# Patient Record
Sex: Female | Born: 1975 | Hispanic: No | Marital: Single | Smoking: Never smoker
Health system: Southern US, Community
[De-identification: ages and names within clinical notes are randomized; demographics above are authoritative.]

## PROBLEM LIST (undated history)

## (undated) DIAGNOSIS — R87629 Unspecified abnormal cytological findings in specimens from vagina: Secondary | ICD-10-CM

## (undated) DIAGNOSIS — Z789 Other specified health status: Secondary | ICD-10-CM

## (undated) HISTORY — DX: Unspecified abnormal cytological findings in specimens from vagina: R87.629

---

## 2002-11-03 ENCOUNTER — Ambulatory Visit (HOSPITAL_COMMUNITY): Admission: RE | Admit: 2002-11-03 | Discharge: 2002-11-03 | Payer: Self-pay | Admitting: *Deleted

## 2003-01-25 ENCOUNTER — Inpatient Hospital Stay (HOSPITAL_COMMUNITY): Admission: AD | Admit: 2003-01-25 | Discharge: 2003-01-26 | Payer: Self-pay | Admitting: *Deleted

## 2005-11-21 ENCOUNTER — Encounter: Admission: RE | Admit: 2005-11-21 | Discharge: 2005-11-21 | Payer: Self-pay | Admitting: Family Medicine

## 2011-07-17 ENCOUNTER — Encounter (HOSPITAL_COMMUNITY): Payer: Self-pay | Admitting: *Deleted

## 2011-07-17 ENCOUNTER — Emergency Department (HOSPITAL_COMMUNITY)
Admission: EM | Admit: 2011-07-17 | Discharge: 2011-07-17 | Disposition: A | Discharge status: Home / Self Care | Payer: Self-pay | Attending: Emergency Medicine | Admitting: Emergency Medicine

## 2011-07-17 ENCOUNTER — Emergency Department (HOSPITAL_COMMUNITY): Payer: Self-pay

## 2011-07-17 DIAGNOSIS — S68119A Complete traumatic metacarpophalangeal amputation of unspecified finger, initial encounter: Secondary | ICD-10-CM

## 2011-07-17 DIAGNOSIS — S62639B Displaced fracture of distal phalanx of unspecified finger, initial encounter for open fracture: Secondary | ICD-10-CM | POA: Insufficient documentation

## 2011-07-17 DIAGNOSIS — IMO0002 Reserved for concepts with insufficient information to code with codable children: Secondary | ICD-10-CM | POA: Insufficient documentation

## 2011-07-17 DIAGNOSIS — S61209A Unspecified open wound of unspecified finger without damage to nail, initial encounter: Secondary | ICD-10-CM | POA: Insufficient documentation

## 2011-07-17 DIAGNOSIS — Y93G3 Activity, cooking and baking: Secondary | ICD-10-CM | POA: Insufficient documentation

## 2011-07-17 DIAGNOSIS — W260XXA Contact with knife, initial encounter: Secondary | ICD-10-CM | POA: Insufficient documentation

## 2011-07-17 DIAGNOSIS — R509 Fever, unspecified: Secondary | ICD-10-CM | POA: Insufficient documentation

## 2011-07-17 LAB — BASIC METABOLIC PANEL
BUN: 5 mg/dL — ABNORMAL LOW (ref 6–23)
CO2: 24 mEq/L (ref 19–32)
Calcium: 8.8 mg/dL (ref 8.4–10.5)
Chloride: 105 mEq/L (ref 96–112)
Creatinine, Ser: 0.51 mg/dL (ref 0.50–1.10)
GFR calc Af Amer: >90 mL/min (ref >90)
GFR calc non Af Amer: >90 mL/min (ref >90)
Glucose, Bld: 98 mg/dL (ref 70–99)
Potassium: 3.5 mEq/L (ref 3.5–5.1)
Sodium: 139 mEq/L (ref 135–145)

## 2011-07-17 LAB — CBC
HCT: 36.9 % (ref 36.0–46.0)
Hemoglobin: 12.6 g/dL (ref 12.0–15.0)
MCH: 27.5 pg (ref 26.0–34.0)
MCHC: 34.1 g/dL (ref 30.0–36.0)
MCV: 80.4 fL (ref 78.0–100.0)
Platelets: 221 10*3/uL (ref 150–400)
RBC: 4.59 MIL/uL (ref 3.87–5.11)
RDW: 13.5 % (ref 11.5–15.5)
WBC: 10.7 10*3/uL — ABNORMAL HIGH (ref 4.0–10.5)

## 2011-07-17 MED ORDER — LIDOCAINE HCL 2 % IJ SOLN
INTRAMUSCULAR | Status: AC
  Filled 2011-07-17: qty 1

## 2011-07-17 MED ORDER — CEPHALEXIN 500 MG PO CAPS: 500.0000 mg | ORAL_CAPSULE | Freq: Four times a day (QID) | ORAL | Status: AC

## 2011-07-17 MED ORDER — TETANUS-DIPHTH-ACELL PERTUSSIS 5-2.5-18.5 LF-MCG/0.5 IM SUSP
0.5000 mL | Freq: Once | INTRAMUSCULAR | Status: AC
  Administered 2011-07-17: 0.5 mL via INTRAMUSCULAR

## 2011-07-17 MED ORDER — CEFAZOLIN SODIUM 1-5 GM-% IV SOLN
1.0000 g | Freq: Once | INTRAVENOUS | Status: AC
  Administered 2011-07-17: 1 g via INTRAVENOUS

## 2011-07-17 MED ORDER — ACETAMINOPHEN 325 MG PO TABS
650.0000 mg | ORAL_TABLET | Freq: Once | ORAL | Status: AC
  Administered 2011-07-17: 650 mg via ORAL

## 2011-07-17 MED ORDER — OXYCODONE HCL 5 MG PO CAPS: 10.0000 mg | ORAL_CAPSULE | ORAL | Status: AC | PRN

## 2011-07-17 MED ORDER — LIDOCAINE HCL 2 % IJ SOLN: 20.0000 mL | Freq: Once | INTRAMUSCULAR | Status: DC

## 2011-07-17 MED ORDER — ACETAMINOPHEN 325 MG PO TABS
ORAL_TABLET | ORAL | Status: AC
  Filled 2011-07-17: qty 2

## 2011-07-17 MED ORDER — MORPHINE SULFATE 4 MG/ML IJ SOLN
4.0000 mg | Freq: Once | INTRAMUSCULAR | Status: AC
  Administered 2011-07-17: 4 mg via INTRAVENOUS

## 2011-07-17 NOTE — ED Notes (Signed)
Accepted care of patient. Patient in room with friend. No complaints of pain.

## 2011-07-17 NOTE — ED Provider Notes (Signed)
History     CSN: [id number]  Arrival date & time 07/17/11  1228   First MD Initiated Contact with Patient 07/17/11 1259      Chief Complaint  Patient presents with  . Extremity Laceration  . Hand Injury    (Consider location/radiation/quality/duration/timing/severity/associated sxs/prior treatment) Patient is a 36 y.o. female presenting with hand injury. The history is provided by the patient. A language interpreter was used.  Hand Injury  The incident occurred 1 to 2 hours ago. The incident occurred at home. Injury mechanism: a cut. The pain is present in the right fingers. The pain is moderate. The pain has been constant since the incident. Associated symptoms include a fever. It is unknown if a foreign body is present. The symptoms are aggravated by palpation. Treatments tried: direct pressure.    No past medical history on file.  No past surgical history on file.  No family history on file.  History  Substance Use Topics  . Smoking status: Not on file  . Smokeless tobacco: Not on file  . Alcohol Use: Not on file    OB History    No data available      Review of Systems  Constitutional: Positive for fever.  Gastrointestinal: Negative for nausea and vomiting.  Skin: Positive for wound. Negative for rash.  Hematological: Does not bruise/bleed easily.  All other systems reviewed and are negative.    Allergies  Review of patient's allergies indicates no known allergies.  Home Medications  No current outpatient prescriptions on file.  BP 130/87  Pulse 116  Temp 101.7 F (38.7 C) (Oral)  Resp 22  SpO2 100%  LMP 06/30/2011  Physical Exam  Nursing note and vitals reviewed. Constitutional: She is oriented to person, place, and time. She appears well-developed and well-nourished.  HENT:  Head: Normocephalic and atraumatic.  Eyes: Pupils are equal, round, and reactive to light.  Cardiovascular: Normal rate, regular rhythm, normal heart sounds and intact  distal pulses.   Pulmonary/Chest: Effort normal and breath sounds normal. No respiratory distress.  Abdominal: Soft. She exhibits no distension. There is no tenderness.  Musculoskeletal:       Hands: Neurological: She is alert and oriented to person, place, and time.  Skin: Skin is warm and dry.    ED Course  Procedures (including critical care time)  Labs Reviewed - No data to display Dg Hand Complete Right  07/17/2011  *RADIOLOGY REPORT*  Clinical Data: Laceration.  RIGHT HAND - COMPLETE 3+ VIEW  Comparison: None.  Findings: There is amputation of the distal most aspect of the right ring finger including the bulk of the tuft.  No radiopaque foreign body is identified.  No other acute finding.  IMPRESSION: Amputation of the distal aspect of the right ring finger.  Original Report Authenticated By: Bernadene Bell. D'ALESSIO, M.D.     1. Traumatic amputation of fingertip       MDM  Sit 36 year old female who was cutting something in her kitchen when she accidentally cut off the tip of her right index finger. She has no other injuries, bleeding was controlled. The patient safe the distal tip of her finger. She does not know when her last tetanus shot was. She is right-handed. On exam she has an amputation of her distal phalanx of her right ring finger, just proximal to the nail; bleeding is controlled. X-ray shows amputation of the distal aspect of that finger. We'll update tdap and consult hand surgery.   Spoke with Dr Amanda Pea,  who will take the patient to the OR for repair. Will give dose of Ancef as per his request, and send the patient over to CDU while waiting for him to be available to take her the OR .     Theotis Burrow, MD 07/17/11 1426

## 2011-07-17 NOTE — Consult Note (Signed)
Reason for Consult: Amputation to the right ring finger Referring Physician: ER staff  Beverly Chang is an 36 y.o. female.  HPI: Patient is a 36 year old female who presents with a amputation to the right ring finger this was sustained while trying to prepare food. She lacerated her right ring finger through the bone involving the nailbed and nail plate bone and associated soft tissues she denies other injury. Marland Kitchen.Patient presents for evaluation and treatment of the of their upper extremity predicament. The patient denies neck back chest or of abdominal pain. The patient notes that they have no lower extremity problems. The patient from primarily complains of the upper extremity pain noted.  History reviewed. No pertinent past medical history.  History reviewed. No pertinent past surgical history.  No family history on file.  Social History:  does not have a smoking history on file. She does not have any smokeless tobacco history on file. Her alcohol and drug histories not on file.  Allergies: No Known Allergies  Medications: I have reviewed the patient's current medications.  Results for orders placed during the hospital encounter of 07/17/11 (from the past 48 hour(s))  CBC     Status: Abnormal   Collection Time   07/17/11  4:02 PM      Component Value Range Comment   WBC 10.7 (*) 4.0 - 10.5 K/uL    RBC 4.59  3.87 - 5.11 MIL/uL    Hemoglobin 12.6  12.0 - 15.0 g/dL    HCT 45.4  09.8 - 11.9 %    MCV 80.4  78.0 - 100.0 fL    MCH 27.5  26.0 - 34.0 pg    MCHC 34.1  30.0 - 36.0 g/dL    RDW 14.7  82.9 - 56.2 %    Platelets 221  150 - 400 K/uL   BASIC METABOLIC PANEL     Status: Abnormal   Collection Time   07/17/11  4:02 PM      Component Value Range Comment   Sodium 139  135 - 145 mEq/L    Potassium 3.5  3.5 - 5.1 mEq/L    Chloride 105  96 - 112 mEq/L    CO2 24  19 - 32 mEq/L    Glucose, Bld 98  70 - 99 mg/dL    BUN 5 (*) 6 - 23 mg/dL    Creatinine, Ser 1.30  0.50 - 1.10 mg/dL    Calcium 8.8  8.4 - 86.5 mg/dL    GFR calc non Af Amer >90  >90 mL/min    GFR calc Af Amer >90  >90 mL/min     Dg Hand Complete Right  07/17/2011  *RADIOLOGY REPORT*  Clinical Data: Laceration.  RIGHT HAND - COMPLETE 3+ VIEW  Comparison: None.  Findings: There is amputation of the distal most aspect of the right ring finger including the bulk of the tuft.  No radiopaque foreign body is identified.  No other acute finding.  IMPRESSION: Amputation of the distal aspect of the right ring finger.  Original Report Authenticated By: Bernadene Bell. Maricela Curet, M.D.    Review of Systems  HENT: Negative.   Eyes: Negative.   Cardiovascular: Negative.   Gastrointestinal: Negative.   Skin: Negative.   Neurological: Negative.   Endo/Heme/Allergies: Negative.   Psychiatric/Behavioral: Negative.     Blood pressure 111/72, pulse 82, temperature 97.2 F (36.2 C), temperature source Oral, resp. rate 16, last menstrual period 06/30/2011, SpO2 98.00%. Physical Exam..The patient is alert and oriented in no  acute distress the patient complains of pain in the affected upper extremity. The patient is noted to have a normal HEENT exam. Lung fields show equal chest expansion and no shortness of breath abdomen exam is nontender without distention. Lower extremity examination does not show any fracture dislocation or blood clot symptoms. Pelvis is stable neck and back are stable and nontender  The patient has a right ring finger amputation at the distal phalanx with exposed bone and nailbed    Assessment/Plan:Marland Kitchen.We are planning surgery for your upper extremity. The risk and benefits of surgery include risk of bleeding infection anesthesia damage to normal structures and failure of the surgery to accomplish its intended goals of relieving symptoms and restoring function with this in mind we'll going to proceed. I have specifically discussed with the patient the pre-and postoperative regime and the does and don'ts and risk and  benefits in great detail. Risk and benefits of surgery also include risk of dystrophy chronic nerve pain failure of the healing process to go onto completion and other inherent risks of surgery The relavent the pathophysiology of the disease/injury process, as well as the alternatives for treatment and postoperative course of action has been discussed in great detail with the patient who desires to proceed.  We will do everything in our power to help you (the patient) restore function to the upper extremity. Is a pleasure to see this patient today.   Karen Chafe 07/17/2011, 8:37 PM

## 2011-07-17 NOTE — ED Notes (Signed)
Cut rt. Ringer off with knife when cooking. Bleeding controlled. Febrile.

## 2011-07-17 NOTE — Discharge Summary (Signed)
  See consult note  Final discharge diagnosis: Right ring finger indications status post surgical intervention performed by myself today in the form of irrigation and debridement and volar bands flap with ORIF of the bone  We in gram at

## 2011-07-17 NOTE — ED Notes (Signed)
Dr Amanda Pea in to see patient. End of finger has been amputated by patient- surgery will be accomplished in patient's room

## 2011-07-17 NOTE — ED Notes (Signed)
Blood drawn for labs.

## 2011-07-17 NOTE — Consult Note (Signed)
  See Dictation # [id number] Oletta Cohn MD

## 2011-07-17 NOTE — ED Notes (Signed)
MD at bedside. 

## 2011-07-18 NOTE — Op Note (Signed)
NAMEKIANNI, LHEUREUX NO.:  [id number]  MEDICAL RECORD NO.:  [id number]  LOCATION:  CD02C                        FACILITY:  [facility]  PHYSICIAN:  Dionne Ano. Cartrell Bentsen, M.D.DATE OF BIRTH:  [birth year]  DATE OF PROCEDURE: DATE OF DISCHARGE:  07/17/2011                              OPERATIVE REPORT   PREOPERATIVE DIAGNOSIS:  Right ring finger amputation.  POSTOPERATIVE DIAGNOSIS:  Right ring finger amputation.  PROCEDURES: 1. Irrigation and debridement, excisional nature, skin and     subcutaneous tissue, bone, nail bed, nail plate, right ring finger. 2. Nail plate removal. 3. Nail bed repair. 4. Treatment of open distal phalanx fracture. 5. Volar advancement flap, right ring finger.  SURGEON:  Dionne Ano. Amanda Pea, MD  ASSISTANT:  None.  COMPLICATION:  None.  ANESTHESIA:  Flexor tendon sheath/intermetacarpal block.  INDICATIONS FOR THE PROCEDURE:  The patient is a pleasant female who sustained an amputation today.  I have been asked to take over her care. She understands the risks and benefits of surgery and desires to proceed.  OPERATION IN DETAIL:  The patient was seen by myself and underwent a thorough discussion in regards to the procedure.  Then, an intermetacarpal/flexor tendon sheath block was performed.  Following this, I performed I and D of skin, subcutaneous tissue, bone, nail bed, nail plate.  This was an excisional debridement with copious amounts of saline and two separate Betadine scrubs.  Following this, I then performed nail plate removal.  Following this, I then performed open treatment at the distal phalanx fracture.  Portions were removed and portions were set.  I utilized combination of orthopedic instruments, rongeur and necessary fixation technique with suturing material to stabilize the area.  The patient tolerated this well.  Following this, I then performed a volar advancement flap.  The flap was advanced nicely into the  defect over the bone, which gave good coverage.  At this juncture, I then performed a trimming and sculpting of the flap made sure the viability was excellent and sutured it with multiple 5-0 chromic sutures.  Once this was done, I then performed a nail bed repair with chromic sutures and following this, deflated the tourniquet.  I completely irrigated once again as I did at multiple points during the procedure and following this, I then performed placement of Adaptic under the eponychial fold and dressed the finger as well as splinted it.  The patient tolerated this well.  She will be discharged home on Keflex, OxyIR, Peri-Colace, and vitamin C.  Notify me should any problems occur.  We will see her back in the office in 10-14 days for follow up.  These note was discussed and all questions have been encouraged and answered.     Dionne Ano. Amanda Pea, M.D.     [facility]  D:  07/17/2011  T:  07/18/2011  Job:  [id number]

## 2011-07-18 NOTE — ED Provider Notes (Signed)
I saw and evaluated the patient, reviewed the resident's note and I agree with the findings and plan.   Loren Racer, MD 07/18/11 [phone]

## 2012-11-17 ENCOUNTER — Other Ambulatory Visit (HOSPITAL_COMMUNITY): Payer: Self-pay | Admitting: Nurse Practitioner

## 2012-11-17 DIAGNOSIS — O09522 Supervision of elderly multigravida, second trimester: Secondary | ICD-10-CM

## 2012-11-17 LAB — OB RESULTS CONSOLE RUBELLA ANTIBODY, IGM: Rubella: IMMUNE

## 2012-11-17 LAB — OB RESULTS CONSOLE GC/CHLAMYDIA
CHLAMYDIA, DNA PROBE: NEGATIVE
Gonorrhea: NEGATIVE

## 2012-11-17 LAB — OB RESULTS CONSOLE HEPATITIS B SURFACE ANTIGEN: Hepatitis B Surface Ag: NEGATIVE

## 2012-11-17 LAB — OB RESULTS CONSOLE ANTIBODY SCREEN: Antibody Screen: NEGATIVE

## 2012-11-17 LAB — OB RESULTS CONSOLE ABO/RH: RH TYPE: POSITIVE

## 2012-11-17 LAB — OB RESULTS CONSOLE RPR: RPR: NONREACTIVE

## 2012-11-17 LAB — OB RESULTS CONSOLE HIV ANTIBODY (ROUTINE TESTING): HIV: NONREACTIVE

## 2012-11-21 ENCOUNTER — Ambulatory Visit (HOSPITAL_COMMUNITY)
Admission: RE | Admit: 2012-11-21 | Discharge: 2012-11-21 | Disposition: A | Discharge status: Home / Self Care | Payer: Medicaid Other | Source: Ambulatory Visit | Attending: Nurse Practitioner | Admitting: Nurse Practitioner

## 2012-11-21 DIAGNOSIS — O09529 Supervision of elderly multigravida, unspecified trimester: Secondary | ICD-10-CM | POA: Insufficient documentation

## 2012-11-21 DIAGNOSIS — O09522 Supervision of elderly multigravida, second trimester: Secondary | ICD-10-CM

## 2012-11-21 DIAGNOSIS — O358XX0 Maternal care for other (suspected) fetal abnormality and damage, not applicable or unspecified: Secondary | ICD-10-CM | POA: Insufficient documentation

## 2012-11-21 DIAGNOSIS — Z363 Encounter for antenatal screening for malformations: Secondary | ICD-10-CM | POA: Insufficient documentation

## 2012-11-21 DIAGNOSIS — Z1389 Encounter for screening for other disorder: Secondary | ICD-10-CM | POA: Insufficient documentation

## 2013-01-08 NOTE — L&D Delivery Note (Signed)
Delivery Note At 5:46 PM a viable and healthy female was delivered via Vaginal, Spontaneous Delivery (Presentation: ROA;  ).  APGAR: 8, 9; weight 7lb 13.2oz .   Placenta status: Intact, Spontaneous.  Cord: 3 vessels with the following complications: None.  Cord pH: n/a  Anesthesia: None  Episiotomy: None Lacerations: None Suture Repair: n/a Est. Blood Loss (mL): 300  Normal vaginal delivery over intact perineum. Traction and fundal massage during third stage and placenta delivered intact without complications.  Mom to postpartum.  Baby to Couplet care / Skin to Skin. Placenta to birthing suites.  [name], Elena 02/17/2013, 6:05 PM   I was present for delivery and agree with note above. [facility],Maher Shon

## 2013-01-08 NOTE — L&D Delivery Note (Signed)
Attestation of Attending Supervision of Advanced Practitioner (CNM/NP): Evaluation and management procedures were performed by the Advanced Practitioner under my supervision and collaboration. I have reviewed the Advanced Practitioner's note and chart, and I agree with the management and plan.  Janyra Barillas H. 4:23 PM   

## 2013-01-30 LAB — OB RESULTS CONSOLE GBS: STREP GROUP B AG: NEGATIVE

## 2013-02-17 ENCOUNTER — Encounter (HOSPITAL_COMMUNITY): Payer: Self-pay | Admitting: General Practice

## 2013-02-17 ENCOUNTER — Inpatient Hospital Stay (HOSPITAL_COMMUNITY)
Admission: AD | Admit: 2013-02-17 | Discharge: 2013-02-18 | DRG: 775 | Disposition: A | Discharge status: Home / Self Care | Payer: Medicaid Other | Source: Ambulatory Visit | Attending: Obstetrics & Gynecology | Admitting: Obstetrics & Gynecology

## 2013-02-17 DIAGNOSIS — O09529 Supervision of elderly multigravida, unspecified trimester: Principal | ICD-10-CM | POA: Diagnosis present

## 2013-02-17 DIAGNOSIS — IMO0001 Reserved for inherently not codable concepts without codable children: Secondary | ICD-10-CM

## 2013-02-17 HISTORY — DX: Other specified health status: Z78.9

## 2013-02-17 LAB — RPR: RPR Ser Ql: NONREACTIVE

## 2013-02-17 LAB — TYPE AND SCREEN
ABO/RH(D): A POS
ANTIBODY SCREEN: NEGATIVE

## 2013-02-17 LAB — CBC
HEMATOCRIT: 35.9 % — AB (ref 36.0–46.0)
HEMOGLOBIN: 12.6 g/dL (ref 12.0–15.0)
MCH: 28.4 pg (ref 26.0–34.0)
MCHC: 35.1 g/dL (ref 30.0–36.0)
MCV: 81 fL (ref 78.0–100.0)
Platelets: 185 10*3/uL (ref 150–400)
RBC: 4.43 MIL/uL (ref 3.87–5.11)
RDW: 14.8 % (ref 11.5–15.5)
WBC: 8.8 10*3/uL (ref 4.0–10.5)

## 2013-02-17 LAB — ABO/RH: ABO/RH(D): A POS

## 2013-02-17 MED ORDER — LIDOCAINE HCL (PF) 1 % IJ SOLN
30.0000 mL | INTRAMUSCULAR | Status: DC | PRN
  Filled 2013-02-17: qty 30

## 2013-02-17 MED ORDER — ONDANSETRON HCL 4 MG/2ML IJ SOLN
4.0000 mg | Freq: Four times a day (QID) | INTRAMUSCULAR | Status: DC | PRN
Start: 2013-02-17 — End: 2013-02-17

## 2013-02-17 MED ORDER — OXYCODONE-ACETAMINOPHEN 5-325 MG PO TABS: 1.0000 | ORAL_TABLET | ORAL | Status: DC | PRN

## 2013-02-17 MED ORDER — SENNOSIDES-DOCUSATE SODIUM 8.6-50 MG PO TABS
2.0000 | ORAL_TABLET | ORAL | Status: DC
  Administered 2013-02-18: 2 via ORAL
  Filled 2013-02-17: qty 2

## 2013-02-17 MED ORDER — DIBUCAINE 1 % RE OINT: 1.0000 "application " | TOPICAL_OINTMENT | RECTAL | Status: DC | PRN

## 2013-02-17 MED ORDER — ONDANSETRON HCL 4 MG/2ML IJ SOLN: 4.0000 mg | INTRAMUSCULAR | Status: DC | PRN

## 2013-02-17 MED ORDER — IBUPROFEN 600 MG PO TABS: 600.0000 mg | ORAL_TABLET | Freq: Four times a day (QID) | ORAL | Status: DC | PRN

## 2013-02-17 MED ORDER — SIMETHICONE 80 MG PO CHEW: 80.0000 mg | CHEWABLE_TABLET | ORAL | Status: DC | PRN

## 2013-02-17 MED ORDER — IBUPROFEN 600 MG PO TABS
600.0000 mg | ORAL_TABLET | Freq: Four times a day (QID) | ORAL | Status: DC
  Administered 2013-02-17 – 2013-02-18 (×5): 600 mg via ORAL
  Filled 2013-02-17 (×5): qty 1

## 2013-02-17 MED ORDER — PRENATAL MULTIVITAMIN CH
1.0000 | ORAL_TABLET | Freq: Every day | ORAL | Status: DC
  Administered 2013-02-18: 1 via ORAL
  Filled 2013-02-17: qty 1

## 2013-02-17 MED ORDER — LACTATED RINGERS IV SOLN
INTRAVENOUS | Status: DC
Start: 2013-02-17 — End: 2013-02-17
  Administered 2013-02-17: 17:00:00 via INTRAVENOUS

## 2013-02-17 MED ORDER — ZOLPIDEM TARTRATE 5 MG PO TABS: 5.0000 mg | ORAL_TABLET | Freq: Every evening | ORAL | Status: DC | PRN

## 2013-02-17 MED ORDER — DIPHENHYDRAMINE HCL 25 MG PO CAPS: 25.0000 mg | ORAL_CAPSULE | Freq: Four times a day (QID) | ORAL | Status: DC | PRN

## 2013-02-17 MED ORDER — LANOLIN HYDROUS EX OINT: TOPICAL_OINTMENT | CUTANEOUS | Status: DC | PRN

## 2013-02-17 MED ORDER — OXYTOCIN BOLUS FROM INFUSION
500.0000 mL | INTRAVENOUS | Status: DC
  Administered 2013-02-17: 500 mL via INTRAVENOUS

## 2013-02-17 MED ORDER — TETANUS-DIPHTH-ACELL PERTUSSIS 5-2.5-18.5 LF-MCG/0.5 IM SUSP: 0.5000 mL | Freq: Once | INTRAMUSCULAR | Status: DC

## 2013-02-17 MED ORDER — WITCH HAZEL-GLYCERIN EX PADS: 1.0000 "application " | MEDICATED_PAD | CUTANEOUS | Status: DC | PRN

## 2013-02-17 MED ORDER — ONDANSETRON HCL 4 MG PO TABS: 4.0000 mg | ORAL_TABLET | ORAL | Status: DC | PRN

## 2013-02-17 MED ORDER — LIDOCAINE HCL (PF) 1 % IJ SOLN
INTRAMUSCULAR | Status: AC
  Filled 2013-02-17: qty 30

## 2013-02-17 MED ORDER — OXYTOCIN 40 UNITS IN LACTATED RINGERS INFUSION - SIMPLE MED
INTRAVENOUS | Status: AC
  Administered 2013-02-17: 62.5 mL/h via INTRAVENOUS
  Filled 2013-02-17: qty 1000

## 2013-02-17 MED ORDER — LACTATED RINGERS IV SOLN: 500.0000 mL | INTRAVENOUS | Status: DC | PRN

## 2013-02-17 MED ORDER — CITRIC ACID-SODIUM CITRATE 334-500 MG/5ML PO SOLN: 30.0000 mL | ORAL | Status: DC | PRN

## 2013-02-17 MED ORDER — OXYTOCIN 40 UNITS IN LACTATED RINGERS INFUSION - SIMPLE MED
62.5000 mL/h | INTRAVENOUS | Status: DC
  Administered 2013-02-17: 62.5 mL/h via INTRAVENOUS

## 2013-02-17 MED ORDER — ACETAMINOPHEN 325 MG PO TABS: 650.0000 mg | ORAL_TABLET | ORAL | Status: DC | PRN

## 2013-02-17 MED ORDER — BENZOCAINE-MENTHOL 20-0.5 % EX AERO
1.0000 "application " | INHALATION_SPRAY | CUTANEOUS | Status: DC | PRN
  Filled 2013-02-17: qty 56

## 2013-02-17 NOTE — MAU Note (Signed)
Patient states she is having back pain and bloody show. Will need Engineer, structuralpanish translator.

## 2013-02-17 NOTE — MAU Note (Signed)
Pt presents to MAU with c/o uterine contractions since 0930.

## 2013-02-17 NOTE — H&P (Signed)
Beverly Chang is a 38 y.o. female presenting for painful contractions since 9:30am. Normal fetal movement. Bloody show in MAU. No LOF Maternal Medical History:  Reason for admission: Contractions.   Contractions: Onset was 6-12 hours ago.   Frequency: regular.   Perceived severity is moderate.    Fetal activity: Perceived fetal activity is normal.   Last perceived fetal movement was within the past hour.    Prenatal Complications - Diabetes: none.    OB History   Grav Para Term Preterm Abortions TAB SAB Ect Mult Living   4 3 3       3      History reviewed. No pertinent past medical history. History reviewed. No pertinent past surgical history. Family History: family history is not on file. Social History:  reports that she has never smoked. She has never used smokeless tobacco. She reports that she does not drink alcohol or use illicit drugs.   Prenatal Transfer Tool  Maternal Diabetes: No Genetic Screening: Declined Maternal Ultrasounds/Referrals: Normal Fetal Ultrasounds or other Referrals:  None Maternal Substance Abuse:  No Significant Maternal Medications:  None Significant Maternal Lab Results:  Lab values include: Group B Strep negative, Other: varicella non-immune Other Comments:  AMA  Review of Systems  All other systems reviewed and are negative.    Dilation: 5.5 Effacement (%): 100 Station: -2 Exam by:: Beverly Maineheryl Motte, RN Blood pressure 114/83, pulse 125, temperature 98.3 F (36.8 C), temperature source Oral, resp. rate 18, height 5\' 1"  (1.549 m), weight 68.584 kg (151 lb 3.2 oz), last menstrual period 05/17/2012. Maternal Exam:  Uterine Assessment: Contraction strength is moderate.  Contraction duration is 60 seconds. Contraction frequency is regular.   Abdomen: Fetal presentation: vertex  Introitus: Normal vulva. Normal vagina.  Vagina is negative for discharge.  Pelvis: adequate for delivery.   Cervix: Cervix evaluated by digital exam.     Fetal  Exam Fetal Monitor Review: Mode: ultrasound.   Baseline rate: 140.  Variability: moderate (6-25 bpm).   Pattern: accelerations present and no decelerations.    Fetal State Assessment: Category I - tracings are normal.     Physical Exam  Nursing note and vitals reviewed. Constitutional: She is oriented to person, place, and time. She appears well-developed and well-nourished. No distress.  HENT:  Head: Normocephalic and atraumatic.  Eyes: Conjunctivae are normal. Right eye exhibits no discharge. Left eye exhibits no discharge. No scleral icterus.  Cardiovascular: Normal rate.   Respiratory: Effort normal.  GI: Soft. There is no tenderness.  Genitourinary: Vagina normal and uterus normal. No vaginal discharge found.  Musculoskeletal: She exhibits no edema and no tenderness.  Neurological: She is alert and oriented to person, place, and time.  Skin: Skin is warm and dry. She is not diaphoretic.  Psychiatric: She has a normal mood and affect. Her behavior is normal.    Prenatal labs: ABO, Rh:  A pos  Antibody:  negative Rubella:  immune RPR:   negative HBsAg:   negative HIV:   negative GBS: Negative (01/23 0000)   Assessment/Plan: Pt is a 38 y.o. [id number] who presented at 4749w0d in active labor.  LaborAdmitPlan #Labor: Active, 9cm, expectant management #Pain: fentanyl on request #FWB: category 1 #ID: GBS negative   Beverely Chang, Beverly 02/17/2013, 5:14 PM  I examined pt and agree with documentation above and resident plan of care. [facility],Chang

## 2013-02-18 NOTE — Discharge Summary (Signed)
Attestation of Attending Supervision of Fellow: Evaluation and management procedures were performed by the Fellow under my supervision and collaboration.  I have reviewed the Fellow's note and chart, and I agree with the management and plan.    

## 2013-02-18 NOTE — Discharge Instructions (Signed)

## 2013-02-18 NOTE — Progress Notes (Signed)
UR chart review completed.  

## 2013-02-18 NOTE — H&P (Signed)
Attestation of Attending Supervision of Advanced Practitioner (CNM/NP): Evaluation and management procedures were performed by the Advanced Practitioner under my supervision and collaboration. I have reviewed the Advanced Practitioner's note and chart, and I agree with the management and plan.  Domenico Achord H. 6:56 AM

## 2013-02-18 NOTE — Discharge Summary (Signed)
Obstetric Discharge Summary Reason for Admission: onset of labor Prenatal Procedures: none Intrapartum Procedures: spontaneous vaginal delivery Postpartum Procedures: none Complications-Operative and Postpartum: none Hemoglobin  Date Value Ref Range Status  02/17/2013 12.6  12.0 - 15.0 g/dL Final     HCT  Date Value Ref Range Status  02/17/2013 35.9* 36.0 - 46.0 % Final    Physical Exam:  General: alert, cooperative, appears stated age and no distress Lochia: appropriate Uterine Fundus: firm Incision: na DVT Evaluation: No evidence of DVT seen on physical exam. Negative Homan's sign. No cords or calf tenderness. No significant calf/ankle edema.  Discharge Diagnoses: Term Pregnancy-delivered  Discharge Information: Date: 02/18/2013 Activity: pelvic rest Diet: routine Medications: PNV Condition: stable Instructions: refer to practice specific booklet Discharge to: home Follow-up Information   [facility] Schedule an appointment as soon as possible for a visit in 2 weeks. (Followup for Vaginal Delivery and Birth Control)    Specialty:  Home Health Services   Contact information:   [facility] [address] [place] [address] [phone]       Hospital Course 38 y.o. [phone] [facility] 9cm cervical dilation and had a precipitous delivery of healthy infant female. Patient would like to be discharged this evening, after she has been here for 24 hours. No complications.  Discharge home in stable condition.  Desired Depo for contraception and intends to breast feed.   Newborn Data: Live born female  Birth Weight: 7 lb 13.2 oz (3550 g) APGAR: 8, 9  Home with mother.  Beverly Chang 02/18/2013, 7:15 AM  I have seen and examined this patient and agree with above documentation in the resident's note. Mom discharge today. D/c if ok per  pediatricians. Pt will f/u in the HD.    Beverly Chang, M.D. [facility] 02/18/2013 10:38 AM

## 2013-11-09 ENCOUNTER — Encounter (HOSPITAL_COMMUNITY): Payer: Self-pay | Admitting: General Practice

## 2014-04-13 ENCOUNTER — Ambulatory Visit: Payer: Self-pay

## 2014-09-12 ENCOUNTER — Ambulatory Visit (INDEPENDENT_AMBULATORY_CARE_PROVIDER_SITE_OTHER): Payer: Self-pay | Admitting: Internal Medicine

## 2014-09-12 VITALS — BP 110/70 | HR 85 | Temp 98.6°F | Resp 18 | Ht 62.0 in | Wt 141.5 lb

## 2014-09-12 DIAGNOSIS — S6991XA Unspecified injury of right wrist, hand and finger(s), initial encounter: Secondary | ICD-10-CM

## 2014-09-12 DIAGNOSIS — S6981XA Other specified injuries of right wrist, hand and finger(s), initial encounter: Secondary | ICD-10-CM

## 2014-09-12 MED ORDER — TRIAMCINOLONE ACETONIDE 0.1 % EX CREA: 1.0000 "application " | TOPICAL_CREAM | Freq: Two times a day (BID) | CUTANEOUS | Status: DC

## 2014-09-12 NOTE — Progress Notes (Signed)
   Subjective:  This chart was scribed for [name] , MD by Andrew Au, ED Scribe. This patient was seen in room 1 and the patient's care was started at 2:28 PM.   Patient ID: Beverly Chang, female    DOB: [birth year], 39 y.o.   MRN: [id number]  HPI Chief Complaint  Patient presents with  . Hand Pain    C/O right middle pain & swelling since she closed it in a door about 6 mths ago   HPI Comments:  Beverly Chang is a 39 y.o. female who presents to the [facility] complaining of right middle finger pain. Pt states she closed her right middle finger in a car door several months ago. Since injury, she's had persistent pain and swelling to finger and finger nail.   Past Medical History  Diagnosis Date  . Medical history non-contributory    Prior to Admission medications   Medication Sig Start Date End Date Taking? Authorizing Provider  Prenatal Vit-Fe Fumarate-FA (PRENATAL MULTIVITAMIN) TABS tablet Take 1 tablet by mouth daily at 12 noon.    Historical Provider, MD   Review of Systems  Skin: Negative for color change and wound.  Neurological: Negative for weakness and numbness.       Objective:   Physical Exam  Constitutional: She is oriented to person, place, and time. She appears well-developed and well-nourished. No distress.  HENT:  Head: Normocephalic and atraumatic.  Eyes: Conjunctivae and EOM are normal.  Neck: Neck supple.  Cardiovascular: Normal rate.   Pulmonary/Chest: Effort normal.  Musculoskeletal: Normal range of motion.  Neurological: She is alert and oriented to person, place, and time.  Skin: Skin is warm and dry.  Right third distal finger is slightly swollen at the nailbed and slightly tender with disfigured nail that includes scaling and reaches which is nontender. It is loosely attached at the nailbed.  Psychiatric: She has a normal mood and affect. Her behavior is normal.  Nursing note and vitals reviewed.  Filed Vitals:   09/12/14 1401  BP: 110/70  Pulse: 85  Temp: 98.6 F (37 C)  TempSrc: Oral  Resp: 18  Height:  (1.575 m)  Weight: 141 lb 8 oz (64.184 kg)  SpO2: 99%    Assessment & Plan:  Problem #1 nailbed trauma with disrupted nail growth  Patient Instructions  You suffered a traumatic injury to the part of your finger where the new fingernail grows. The resulting nail may always look deformed and this may never get completely well. We will try treatment to see if there is improvement over the next 3-6 months   Meds ordered this encounter  Medications  . triamcinolone cream (KENALOG) 0.1 %    Sig: Apply 1 application topically 2 (two) times daily. For 1 month    Dispense:  30 g    Refill:  0    I have completed the patient encounter in its entirety as documented by the scribe, with editing by me where necessary. Travell Desaulniers P. Merla Riches, M.D.

## 2014-09-12 NOTE — Patient Instructions (Signed)
You suffered a traumatic injury to the part of your finger where the new fingernail grows. The resulting nail may always look deformed and this may never get completely well. We will try treatment to see if there is improvement over the next 3-6 months

## 2015-02-20 IMAGING — US US OB DETAIL+14 WK
1 of 2 series · 12 of 28 positions shown · non-contrast
Comparison: none

[Series 1: us ob detail +14 wk · 73 acquisitions, 12 frames shown]
[im 3/73]
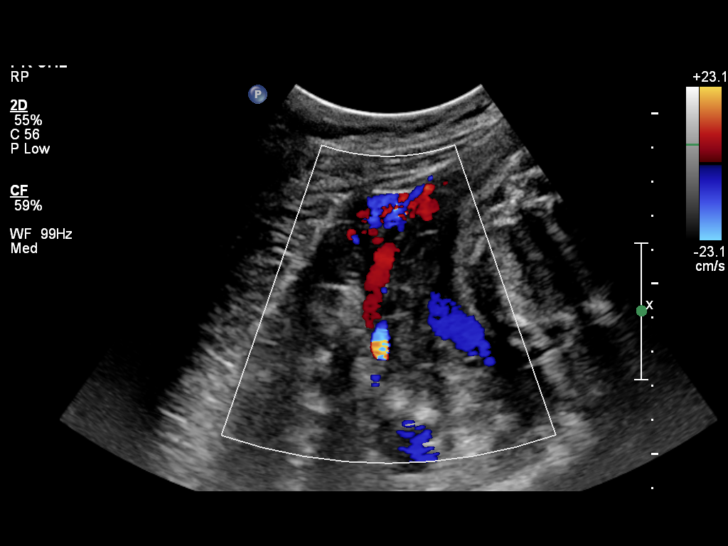
[im 9/73]
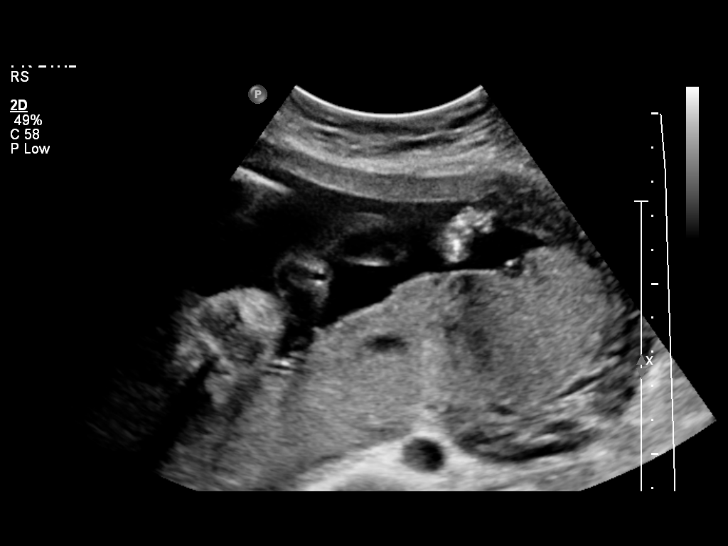
[im 14/73]
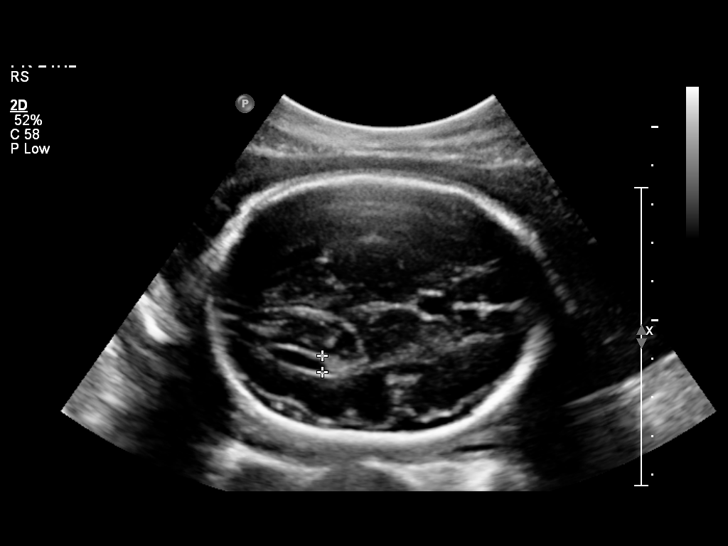
[im 23/73]
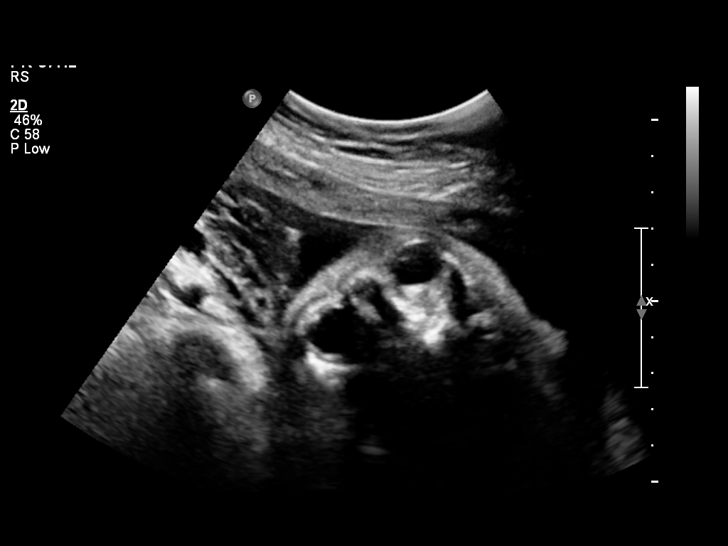
[im 28/73]
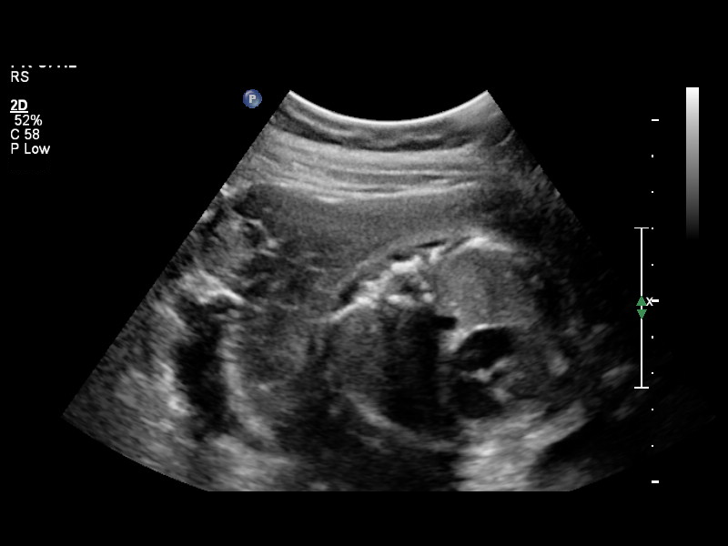
[im 34/73]
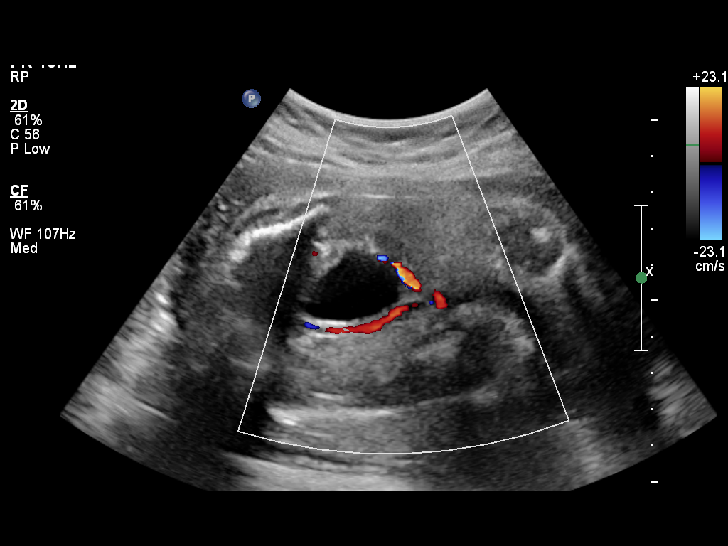
[im 42/73]
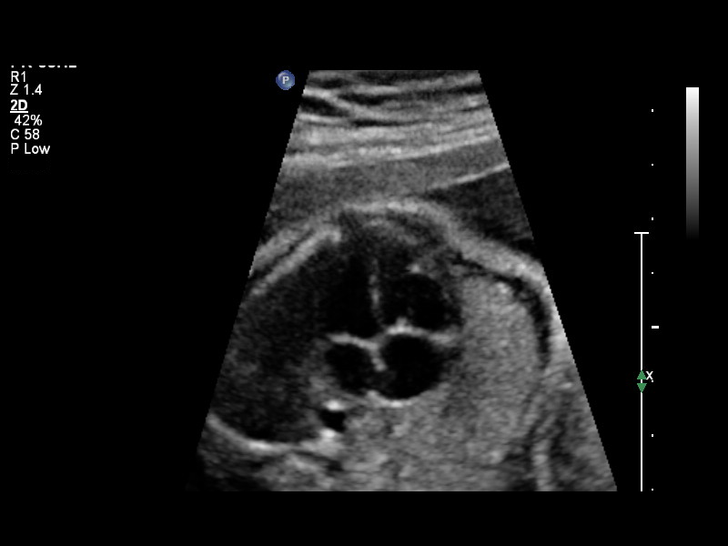
[im 48/73]
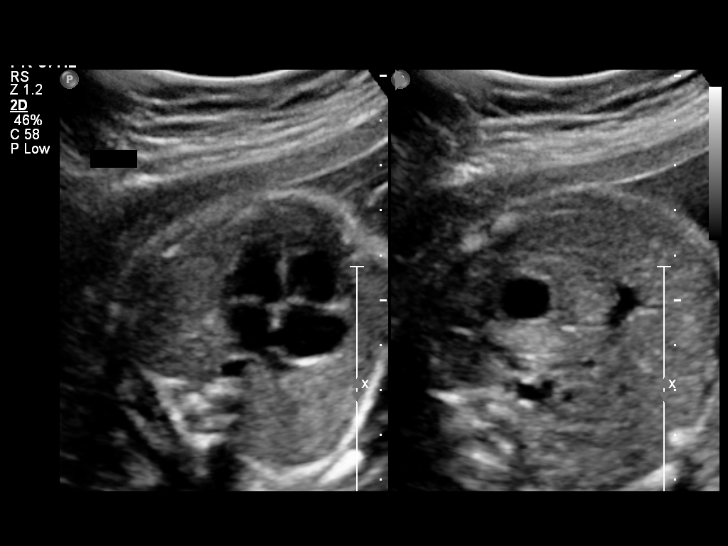
[im 53/73]
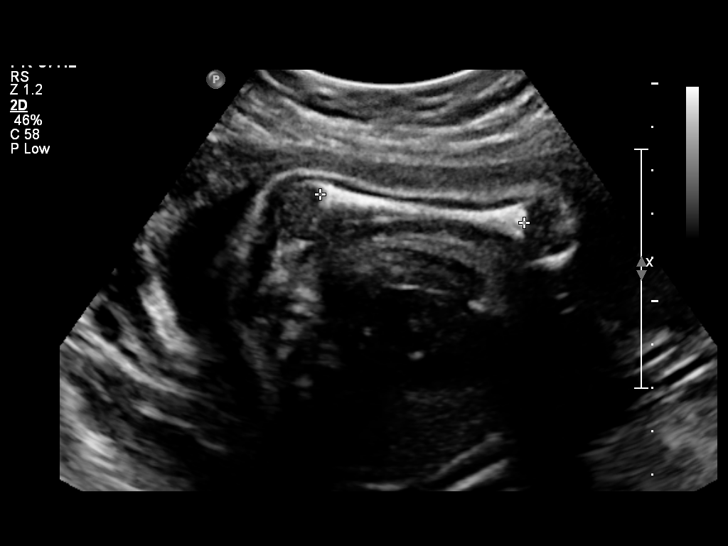
[im 61/73]
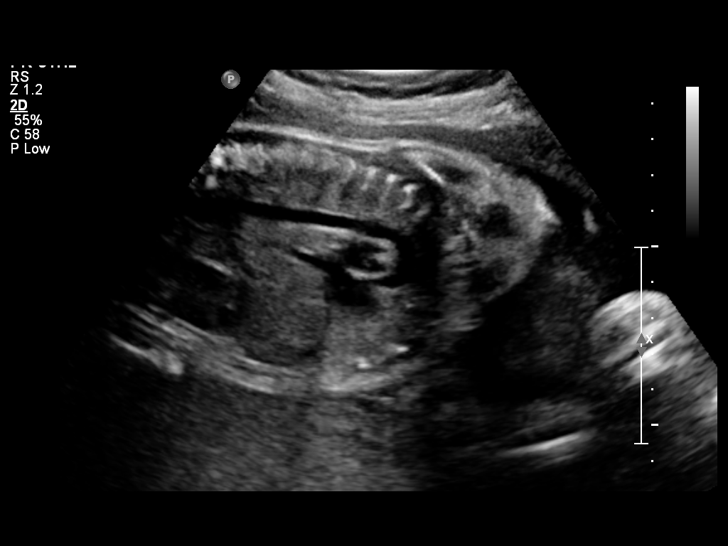
[im 67/73]
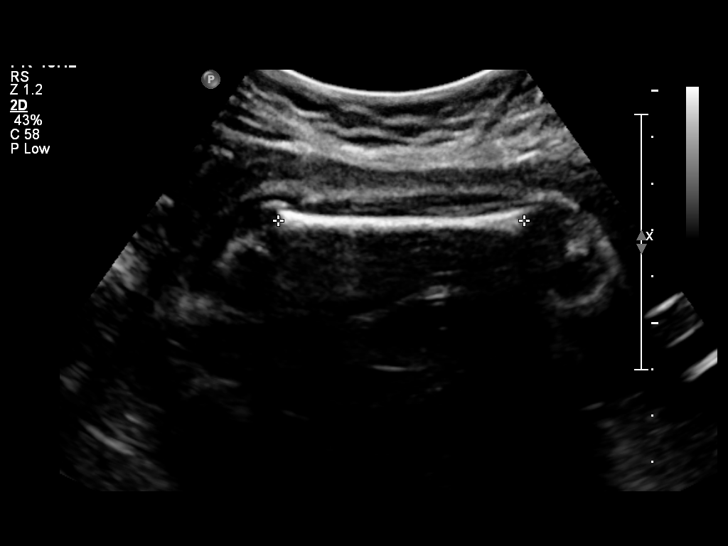
[im 73/73]
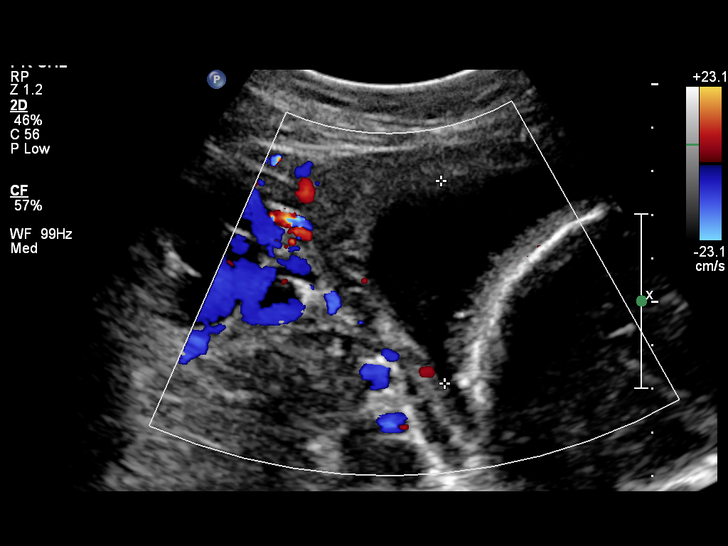

[12 of 28 positions shown; findings below may reference images not displayed]

OBSTETRICS REPORT
                      (Signed Final 11/21/2012 [DATE])

Service(s) Provided

 US OB DETAIL + 14 WK                                  76811.0
Indications

 Detailed fetal anatomic survey
 Advanced maternal age (AMA), Multigravida
Fetal Evaluation

 Num Of Fetuses:    1
 Fetal Heart Rate:  140                          bpm
 Cardiac Activity:  Observed
 Presentation:      Cephalic
 Placenta:          Posterior, above cervical
                    os
 P. Cord            Visualized, central
 Insertion:

 Amniotic Fluid
 AFI FV:      Subjectively within normal limits
                                             Larg Pckt:    4.64  cm
 RLQ:   4.64    cm
Biometry

 BPD:       66  mm     G. Age:  26w 4d                CI:        68.31   70 - 86
                                                      FL/HC:      20.3   18.6 -

 HC:     255.3  mm     G. Age:  27w 5d       52  %    HC/AC:      1.08   1.05 -

 AC:     235.3  mm     G. Age:  27w 6d       72  %    FL/BPD:     78.6   71 - 87
 FL:      51.9  mm     G. Age:  27w 5d       61  %    FL/AC:      22.1   20 - 24
 HUM:     47.2  mm     G. Age:  27w 5d       67  %

 Est. FW:    5557  gm      2 lb 7 oz     71  %
Gestational Age

 LMP:           26w 6d        Date:  05/17/12                 EDD:   02/21/13
 U/S Today:     27w 3d                                        EDD:   02/17/13
 Best:          26w 6d     Det. By:  LMP  (05/17/12)          EDD:   02/21/13
Anatomy
 Cranium:          Appears normal         Aortic Arch:      Appears normal
 Fetal Cavum:      Appears normal         Ductal Arch:      Appears normal
 Ventricles:       Appears normal         Diaphragm:        Appears normal
 Choroid Plexus:   Appears normal         Stomach:          Appears normal, left
                                                            sided
 Cerebellum:       Appears normal         Abdomen:          Appears normal
 Posterior Fossa:  Appears normal         Abdominal Wall:   Appears nml (cord
                                                            insert, abd wall)
 Nuchal Fold:      Not applicable (>20    Cord Vessels:     Appears normal (3
                   wks GA)                                  vessel cord)
 Face:             Appears normal         Kidneys:          Appear normal
                   (orbits and profile)
 Lips:             Appears normal         Bladder:          Appears normal
 Heart:            Appears normal         Spine:            Appears normal
                   (4CH, axis, and
                   situs)
 RVOT:             Appears normal         Lower             Appears normal
                                          Extremities:
 LVOT:             Appears normal         Upper             Appears normal
                                          Extremities:

 Other:  Female gender. Heels and 5th digit visualized.
Cervix Uterus Adnexa

 Cervical Length:    3.7      cm

 Cervix:
 Uterus:       No abnormality visualized.

 Left Ovary:    No adnexal mass visualized.
 Right Ovary:   No adnexal mass visualized.
 Adnexa:     No abnormality visualized.
Comments

 The patient's fetal anatomic survey is now complete.  No fetal
 anomalies or soft markers of aneuploidy were seen.  No
 further anatomic assessments are required unless additional
 problems arise
Impression

 Single living intrauterine pregnancy at 26 weeks 6 days.
 Appropriate fetal growth (71%).
 Normal amniotic fluid volume.
 Normal fetal anatomy.
 No fetal anomalies or soft markers of aneuploidy seen.
Recommendations

 Follow-up ultrasounds as clinically indicated.

                Gzz, Jeroen

## 2021-10-02 DIAGNOSIS — R922 Inconclusive mammogram: Secondary | ICD-10-CM | POA: Diagnosis not present

## 2021-10-02 DIAGNOSIS — R928 Other abnormal and inconclusive findings on diagnostic imaging of breast: Secondary | ICD-10-CM | POA: Diagnosis not present

## 2021-10-18 ENCOUNTER — Encounter: Payer: BC Managed Care – PPO | Admitting: Certified Nurse Midwife

## 2021-12-04 ENCOUNTER — Encounter: Payer: BC Managed Care – PPO | Admitting: Family Medicine

## 2021-12-25 ENCOUNTER — Encounter: Payer: Self-pay | Admitting: Obstetrics and Gynecology

## 2021-12-25 ENCOUNTER — Other Ambulatory Visit (HOSPITAL_COMMUNITY)
Admission: RE | Admit: 2021-12-25 | Discharge: 2021-12-25 | Disposition: A | Discharge status: Home / Self Care | Payer: BC Managed Care – PPO | Source: Ambulatory Visit | Attending: Certified Nurse Midwife | Admitting: Certified Nurse Midwife

## 2021-12-25 ENCOUNTER — Ambulatory Visit (INDEPENDENT_AMBULATORY_CARE_PROVIDER_SITE_OTHER): Payer: BC Managed Care – PPO | Admitting: Obstetrics and Gynecology

## 2021-12-25 VITALS — BP 125/78 | HR 81 | Ht 63.0 in | Wt 145.9 lb

## 2021-12-25 DIAGNOSIS — R87615 Unsatisfactory cytologic smear of cervix: Secondary | ICD-10-CM | POA: Insufficient documentation

## 2021-12-25 DIAGNOSIS — R87619 Unspecified abnormal cytological findings in specimens from cervix uteri: Secondary | ICD-10-CM | POA: Insufficient documentation

## 2021-12-25 DIAGNOSIS — Z202 Contact with and (suspected) exposure to infections with a predominantly sexual mode of transmission: Secondary | ICD-10-CM

## 2021-12-25 NOTE — Progress Notes (Signed)
Beverly Chang presents for pap smear and STD testing Pap smear with PCP was unsatisfactory due to insufficient cells. Desires STD testing Denies any GYN problems Mammogram UTD  PE AF VSS Chaperone present  Lungs clear Heart  RRR Abd soft  +BS GU Nl EGBUS, menses noted, pap smear collected, uterus , small mobile, no masses  A/P Abnormal pap smear        STD testing  F/U per test results Live interrupter used during today's exam

## 2021-12-25 NOTE — Patient Instructions (Signed)

## 2021-12-26 ENCOUNTER — Telehealth: Payer: Self-pay

## 2021-12-26 LAB — HEPATITIS B SURFACE ANTIGEN: Hepatitis B Surface Ag: NEGATIVE

## 2021-12-26 LAB — HEPATITIS C ANTIBODY: Hep C Virus Ab: NONREACTIVE

## 2021-12-26 LAB — RPR: RPR Ser Ql: NONREACTIVE

## 2021-12-26 LAB — HIV ANTIBODY (ROUTINE TESTING W REFLEX): HIV Screen 4th Generation wRfx: NONREACTIVE

## 2021-12-26 NOTE — Telephone Encounter (Signed)
-----   Message from Hermina Staggers, MD sent at 12/26/2021 10:38 AM EST ----- Please let pt know that her STD blood work was negative.  Thanks Casimiro Needle

## 2021-12-26 NOTE — Telephone Encounter (Signed)
Called patient with the help of Claudia. Left VM that we were calling with results and would call back at later time.

## 2021-12-27 NOTE — Telephone Encounter (Signed)
Called patient with Eda assisting with spanish interpretation, no answer- left message that her bloodwork was normal and if she has any questions to call us back.

## 2021-12-28 LAB — CYTOLOGY - PAP
Chlamydia: NEGATIVE
Comment: NEGATIVE
Comment: NEGATIVE
Comment: NORMAL
High risk HPV: POSITIVE — AB
Neisseria Gonorrhea: NEGATIVE

## 2022-01-11 ENCOUNTER — Encounter: Payer: Self-pay | Admitting: Obstetrics and Gynecology

## 2022-01-11 ENCOUNTER — Other Ambulatory Visit: Payer: Self-pay | Admitting: Obstetrics and Gynecology

## 2022-01-11 DIAGNOSIS — R87619 Unspecified abnormal cytological findings in specimens from cervix uteri: Secondary | ICD-10-CM | POA: Insufficient documentation

## 2022-01-12 ENCOUNTER — Telehealth: Payer: Self-pay

## 2022-01-12 NOTE — Telephone Encounter (Signed)
-----   Message from Chancy Milroy, MD sent at 01/11/2022 10:03 AM EST ----- Please let pt know that her pap smear was abnormal. Please schedule pt for colpo and EMBX. I also placed an order for GYN U/S. Thanks Mihcael

## 2022-01-12 NOTE — Telephone Encounter (Signed)
Called patient using in person interpreter. VM left stating I was calling with results and requesting the patient to call back. Also stated we would try to call patient at a later time.

## 2022-01-15 NOTE — Telephone Encounter (Addendum)
Called pt with interpreter Rosemarie Ax; VM left on patient's phone. Called additional phone number; spoke with husband Nicole Kindred who states pt will be home from work at [place] pt at [employer] with [facility] interpreter [place] [phone]. VM left at patient's number. Called pt's husband who states patient is not home.

## 2022-01-16 NOTE — Telephone Encounter (Signed)
Patient called back into office for results. Informed patient of results & discussed recommended procedures. Ultrasound scheduled for 1/22 & informed patient of appt in office 1/24. Patient verbalized understanding

## 2022-01-16 NOTE — Telephone Encounter (Signed)
Called patient with Rosemarie Ax assisting with Spanish interpretation, no answer- left message to call us back for results

## 2022-01-29 ENCOUNTER — Ambulatory Visit
Admission: RE | Admit: 2022-01-29 | Discharge: 2022-01-29 | Disposition: A | Discharge status: Home / Self Care | Payer: BC Managed Care – PPO | Source: Ambulatory Visit | Attending: Obstetrics and Gynecology | Admitting: Obstetrics and Gynecology

## 2022-01-29 DIAGNOSIS — R87619 Unspecified abnormal cytological findings in specimens from cervix uteri: Secondary | ICD-10-CM | POA: Diagnosis not present

## 2022-01-31 ENCOUNTER — Encounter: Payer: Self-pay | Admitting: Obstetrics & Gynecology

## 2022-01-31 ENCOUNTER — Ambulatory Visit (INDEPENDENT_AMBULATORY_CARE_PROVIDER_SITE_OTHER): Payer: BC Managed Care – PPO | Admitting: Obstetrics & Gynecology

## 2022-01-31 ENCOUNTER — Other Ambulatory Visit (HOSPITAL_COMMUNITY)
Admission: RE | Admit: 2022-01-31 | Discharge: 2022-01-31 | Disposition: A | Discharge status: Home / Self Care | Payer: BC Managed Care – PPO | Source: Ambulatory Visit | Attending: Obstetrics & Gynecology | Admitting: Obstetrics & Gynecology

## 2022-01-31 VITALS — BP 129/75 | HR 79 | Ht 62.0 in | Wt 147.0 lb

## 2022-01-31 DIAGNOSIS — R87619 Unspecified abnormal cytological findings in specimens from cervix uteri: Secondary | ICD-10-CM | POA: Diagnosis not present

## 2022-01-31 DIAGNOSIS — N879 Dysplasia of cervix uteri, unspecified: Secondary | ICD-10-CM | POA: Diagnosis not present

## 2022-01-31 LAB — POCT PREGNANCY, URINE: Preg Test, Ur: NEGATIVE

## 2022-01-31 NOTE — Progress Notes (Signed)
Patient ID: SPECIAL RANES, female   DOB: [birth year], 47 y.o.   MRN: [id number]  Chief Complaint  Patient presents with   Procedure    HPI Beverly Chang is a 47 y.o. female.  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 Previous colposcopy: . Prior cervical treatment: no treatment.  Past Medical History:  Diagnosis Date   Medical history non-contributory    Vaginal Pap smear, abnormal     History reviewed. No pertinent surgical history.  History reviewed. No pertinent family history.  Social History Social History   Tobacco Use   Smoking status: Never   Smokeless tobacco: Never  Substance Use Topics   Alcohol use: No    Alcohol/week: 0.0 standard drinks of alcohol   Drug use: No    No Known Allergies  Current Outpatient Medications  Medication Sig Dispense Refill   Ascorbic Acid (VITAMIN C PO) Take by mouth. (Patient not taking: Reported on 01/31/2022)     No current facility-administered medications for this visit.    Review of Systems Review of Systems  Genitourinary:  Negative for menstrual problem, pelvic pain, vaginal bleeding and vaginal discharge.    Blood pressure 129/75, pulse 79, height 5\' 2"  (1.575 m), weight 147 lb (66.7 kg), last menstrual period 01/16/2022.  Physical Exam Physical Exam Vitals and nursing note reviewed. Exam conducted with a chaperone present.  Constitutional:      Appearance: Normal appearance.  Genitourinary:    General: Normal vulva.     Exam position: Lithotomy position.     Vagina: Normal.  Neurological:     Mental Status: She is alert.    Patient given informed consent, signed copy in the chart, time out was performed.  Placed in lithotomy position. Cervix viewed with speculum and colposcope after application of acetic acid.   Colposcopy adequate?  yes Acetowhite lesions?yes 10 to 1 Punctation?no Mosaicism?  no Abnormal vasculature?  no Biopsies?11 and  12 ECC?yes Patient given informed consent, signed copy in the chart, time out was performed. Appropriate time out taken. . The patient was placed in the lithotomy position and the cervix brought into view with sterile speculum.  Portio of cervix cleansed x 2 with betadine swabs. The uterus was sounded for depth of 8 cm. A pipelle was introduced to into the uterus, suction created,  and an endometrial sample was obtained. All equipment was removed and accounted for.  The patient tolerated the procedure well.    Patient given post procedure instructions.  Emeterio Reeve, MD Data Reviewed Pap result  Assessment    Procedure Details  The risks and benefits of the procedure and Written informed consent obtained.  Speculum placed in vagina and excellent visualization of cervix achieved, cervix swabbed x 3 with acetic acid solution.    Complications: none.     Plan    Specimens labelled and sent to Pathology.      Emeterio Reeve 01/31/2022, 9:33 AM

## 2022-02-02 LAB — SURGICAL PATHOLOGY

## 2022-02-13 NOTE — Progress Notes (Signed)
Low grade biopsy with normal endometrium. Repeat pap in 12 months

## 2022-02-14 ENCOUNTER — Telehealth: Payer: Self-pay | Admitting: *Deleted

## 2022-02-14 NOTE — Telephone Encounter (Addendum)
-----   Message from Woodroe Mode, MD sent at 02/13/2022 10:28 AM EST ----- Low grade biopsy with normal endometrium. Repeat pap in 12 months  2/7  1442  Called pt with [facility] - interpreter. She did not answer. A message was left stating that I am calling with results and will call back at another time.  2/7  1455  Pt called back and she was informed of test results as well as recommended follow up with interpreter Eda Royal.  She voiced understanding and had no questions.
